# Patient Record
Sex: Male | Born: 1980 | Race: White | Hispanic: No | Marital: Married | Smoking: Current every day smoker
Health system: Southern US, Community
[De-identification: ages and names within clinical notes are randomized; demographics above are authoritative.]

---

## 2016-07-25 ENCOUNTER — Ambulatory Visit (INDEPENDENT_AMBULATORY_CARE_PROVIDER_SITE_OTHER): Payer: Self-pay | Admitting: Physician Assistant

## 2016-07-25 ENCOUNTER — Ambulatory Visit (HOSPITAL_COMMUNITY)
Admission: RE | Admit: 2016-07-25 | Discharge: 2016-07-25 | Disposition: A | Discharge status: Home / Self Care | Payer: Self-pay | Source: Ambulatory Visit | Attending: Physician Assistant | Admitting: Physician Assistant

## 2016-07-25 VITALS — BP 130/74 | HR 64 | Temp 98.4°F | Resp 18 | Wt 199.0 lb

## 2016-07-25 DIAGNOSIS — N5089 Other specified disorders of the male genital organs: Secondary | ICD-10-CM

## 2016-07-25 DIAGNOSIS — S3994XA Unspecified injury of external genitals, initial encounter: Secondary | ICD-10-CM

## 2016-07-25 DIAGNOSIS — S39848A Other specified injuries of external genitals, initial encounter: Secondary | ICD-10-CM

## 2016-07-25 DIAGNOSIS — N50812 Left testicular pain: Secondary | ICD-10-CM

## 2016-07-25 DIAGNOSIS — X58XXXA Exposure to other specified factors, initial encounter: Secondary | ICD-10-CM | POA: Insufficient documentation

## 2016-07-25 LAB — POCT URINALYSIS DIP (MANUAL ENTRY)
BILIRUBIN UA: NEGATIVE
BILIRUBIN UA: NEGATIVE
Glucose, UA: NEGATIVE
LEUKOCYTES UA: NEGATIVE
Nitrite, UA: NEGATIVE
Protein Ur, POC: NEGATIVE
Spec Grav, UA: 1.015
Urobilinogen, UA: 0.2
pH, UA: 7

## 2016-07-25 NOTE — Progress Notes (Signed)
Mitchell Ballard  MRN: [id number] DOB: [birth year]  Subjective:  Pt presents to clinic injury to L side of scrotum on Friday - his daughter juped on him and landed on his groin - Sat Mitchell Ballard started with pain which continued Sunday - yesterday the pain was the worse and Mitchell Ballard noticed swelling. (Mitchell Ballard did work a full shift)  Today the pain is better and the swelling is about the same.  No change in urine.  No change in sexual partner. No urinary symptoms.  Mitchell Ballard has used some ice which has helped a little -- Mitchell Ballard rested over the weekend.  Review of Systems  Constitutional: Negative for chills and fever.  Genitourinary: Positive for scrotal swelling and testicular pain. Negative for dysuria, penile pain, penile swelling and urgency.    There are no active problems to display for this patient.   No current outpatient prescriptions on file prior to visit.   No current facility-administered medications on file prior to visit.     No Known Allergies  Pt patients past, family and social history were reviewed and updated.  Objective:  BP 130/74 (BP Location: Right Arm, Patient Position: Sitting, Cuff Size: Small)   Pulse 64   Temp 98.4 F (36.9 C) (Oral)   Resp 18   Wt 199 lb (90.3 kg)   SpO2 100%   Physical Exam  Constitutional: Mitchell Ballard is oriented to person, place, and time and well-developed, well-nourished, and in no distress.  HENT:  Head: Normocephalic and atraumatic.  Right Ear: External ear normal.  Left Ear: External ear normal.  Eyes: Conjunctivae are normal.  Neck: Normal range of motion.  Cardiovascular: Normal rate, regular rhythm and normal heart sounds.   No murmur heard. Pulmonary/Chest: Effort normal and breath sounds normal. Mitchell Ballard has no wheezes.  Genitourinary: Penis normal. Mitchell Ballard exhibits testicular tenderness (left). Mitchell Ballard exhibits no abnormal testicular mass and no scrotal tenderness.  Genitourinary Comments: Left testicle is at a different angle that his right - scrotum has minimal  swelling and no ecchymosis  Neurological: Mitchell Ballard is alert and oriented to person, place, and time. Gait normal.  Skin: Skin is warm and dry.  Psychiatric: Mood, memory, affect and judgment normal.   Results for orders placed or performed in visit on 07/25/16  POCT urinalysis dipstick  Result Value Ref Range   Color, UA yellow yellow   Clarity, UA clear clear   Glucose, UA negative negative   Bilirubin, UA negative negative   Ketones, POC UA negative negative   Spec Grav, UA 1.015    Blood, UA trace-intact (A) negative   pH, UA 7.0    Protein Ur, POC negative negative   Urobilinogen, UA 0.2    Nitrite, UA Negative Negative   Leukocytes, UA Negative Negative   US Scrotum  Result Date: 07/25/2016 CLINICAL DATA:  Left testicular pain and swelling after injury common daughter jumped onto the patient's scrotum. EXAM: SCROTAL ULTRASOUND DOPPLER ULTRASOUND OF THE TESTICLES TECHNIQUE: Complete ultrasound examination of the testicles, epididymis, and other scrotal structures was performed. Color and spectral Doppler ultrasound were also utilized to evaluate blood flow to the testicles. COMPARISON:  None. FINDINGS: Right testicle Measurements: 4.5 x 2.3 x 3.0 cm. No mass or microlithiasis visualized. Left testicle Measurements: 4.0 x 3.1 x 3.5 cm. Multiple heterogeneous hypoechoic areas are seen scattered throughout the diffusely heterogeneous testicular parenchyma. On static 2D images, most of these areas have rounded configuration, but reviewing this in a loops obtained by the sonographer, many are actually  platelike and linear in configuration, consistent with fracture planes. Right epididymis:  Normal in size and appearance. Left epididymis:  Slightly prominent in echogenicity and size. Hydrocele: Trace amount of simple appearing free fluid is seen in the right hemiscrotum. There is a somewhat larger amount of fluid identified in the left hemiscrotum which is complex. 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 Pulsed Doppler interrogation of both testes demonstrates normal low resistance arterial and venous waveforms bilaterally. IMPRESSION: 1. Multi focal heterogeneous and hypoechoic lesions throughout the parenchyma of the left testicle. Although these appear fairly rounded in appearance on static 2D images, cine imaging shows many have a more platelike and linear appearance, suggesting fracture through the testicular parenchyma. Overall echogenicity of the left testicle is markedly heterogeneous. Given the provided history, imaging features today are felt to be most consistent with left testicular trauma with multi focal parenchymal laceration and intraparenchymal hematoma of the left testicle. Probable small associated left-sided intrascrotal hemorrhage. Although multi focal testicular tumor is considered much less likely, ultrasound follow-up is recommended as changes related to trauma should resolve over time. Electronically Signed   By: Kennith Center M.D.   On: 07/25/2016 15:11   [place] Art/ven Flow Abd Pelv Doppler  Result Date: 07/25/2016 CLINICAL DATA:  Left testicular pain and swelling after injury common daughter jumped onto the patient's scrotum. EXAM: SCROTAL ULTRASOUND DOPPLER ULTRASOUND OF THE TESTICLES TECHNIQUE: Complete ultrasound examination of the testicles, epididymis, and other scrotal structures was performed. Color and spectral Doppler ultrasound were also utilized to evaluate blood flow to the testicles. COMPARISON:  None. FINDINGS: Right testicle Measurements: 4.5 x 2.3 x 3.0 cm. No mass or microlithiasis visualized. Left testicle Measurements: 4.0 x 3.1 x 3.5 cm. Multiple heterogeneous hypoechoic areas are seen scattered throughout the diffusely heterogeneous testicular parenchyma. On static 2D images, most of these areas have rounded configuration, but reviewing this in a loops obtained by the sonographer, many are actually platelike and linear in  configuration, consistent with fracture planes. Right epididymis:  Normal in size and appearance. Left epididymis:  Slightly prominent in echogenicity and size. Hydrocele: Trace amount of simple appearing free fluid is seen in the right hemiscrotum. There is a somewhat larger amount of fluid identified in the left hemiscrotum which is complex. 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 Pulsed Doppler interrogation of both testes demonstrates normal low resistance arterial and venous waveforms bilaterally. IMPRESSION: 1. Multi focal heterogeneous and hypoechoic lesions throughout the parenchyma of the left testicle. Although these appear fairly rounded in appearance on static 2D images, cine imaging shows many have a more platelike and linear appearance, suggesting fracture through the testicular parenchyma. Overall echogenicity of the left testicle is markedly heterogeneous. Given the provided history, imaging features today are felt to be most consistent with left testicular trauma with multi focal parenchymal laceration and intraparenchymal hematoma of the left testicle. Probable small associated left-sided intrascrotal hemorrhage. Although multi focal testicular tumor is considered much less likely, ultrasound follow-up is recommended as changes related to trauma should resolve over time. Electronically Signed   By: Kennith Center M.D.   On: 07/25/2016 15:11     Assessment and Plan :  Testicular pain, left - Plan: [place] Art/Ven Flow Abd Pelv Doppler, US Scrotum  Swelling of left testicle - Plan: [place] Art/Ven Flow Abd Pelv Doppler, US Scrotum  Injury to scrotum, initial encounter - Plan: [place] Art/Ven Flow Abd Pelv Doppler, POCT urinalysis dipstick, US Scrotum   Pt to continue ice  and rest - Mitchell Ballard should get some tighter underwear for more support.  Send for US to determine blood flow status and this will help determine next step.  Selena BattenKim - [phone]- [phone] - Mitchell Ballard would like call report to  go to wife - spoke with wife they will decided the next step - Mitchell Ballard will add motrin for pain control and continue the ice and support - d/w pt's wife the possibility of sterility and if Mitchell Ballard develops fever and increased swelling or increase pain that infection is possible though not likely but Mitchell Ballard Mitchell Ballard develops any of these symptoms Mitchell Ballard will RTC for further US and evaluation.  Spoke with Dr Marlou PorchHerrick who reviewed the US.  Supportive care with ice and support and high dose motrin for the pain - likely his testicle will atrophy and Mitchell Ballard has the risk of sterility due to sperm antibodies developing.  No surgical options at this point to change the outcome of possible sterility.  Benny LennertSarah Samiah Ricklefs PA-C  [facility] 07/25/2016 9:04 AM

## 2016-07-25 NOTE — Patient Instructions (Addendum)
  You have an appointment to day at 1pm at [facility] for your ultrasound.  You are to arrive at 1245pm.   Address: [address], [place], [place] [address].  Phone: [phone]     IF you received an x-ray today, you will receive an invoice from [facility]. Please contact [facility] at [phone] with questions or concerns regarding your invoice.   IF you received labwork today, you will receive an invoice from [facility]. Please contact [facility] at [phone] with questions or concerns regarding your invoice.   Our billing staff will not be able to assist you with questions regarding bills from these companies.  You will be contacted with the lab results as soon as they are available. The fastest way to get your results is to activate your My Chart account. Instructions are located on the last page of this paperwork. If you have not heard from us regarding the results in 2 weeks, please contact this office.

## 2016-07-26 ENCOUNTER — Telehealth: Payer: Self-pay

## 2016-07-26 NOTE — Telephone Encounter (Signed)
Selena BattenKim would like to ask Maralyn SagoSarah some questions about the patient's conditions. Please call! [phone]

## 2016-07-27 NOTE — Telephone Encounter (Signed)
Called and spoke with patient's wife - answered questions.

## 2018-01-04 ENCOUNTER — Encounter: Payer: Self-pay | Admitting: Physician Assistant

## 2018-07-05 IMAGING — US US SCROTUM
1 series · 13 of 25 positions shown · non-contrast
Comparison: None.

CLINICAL DATA: Left testicular pain and swelling after injury
common daughter jumped onto the patient's scrotum.

EXAM:
SCROTAL ULTRASOUND
DOPPLER ULTRASOUND OF THE TESTICLES
TECHNIQUE: Complete ultrasound examination of the testicles, epididymis, and
other scrotal structures was performed. Color and spectral Doppler
ultrasound were also utilized to evaluate blood flow to the
testicles.

[Series 1: us scrotum · 0.09mm/px · 130 acquisitions, 13 frames shown]
[im 1/130]
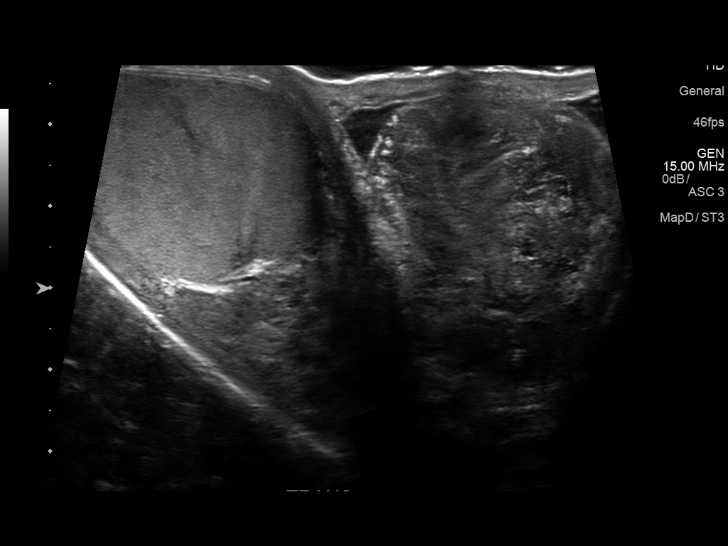
[im 11/130]
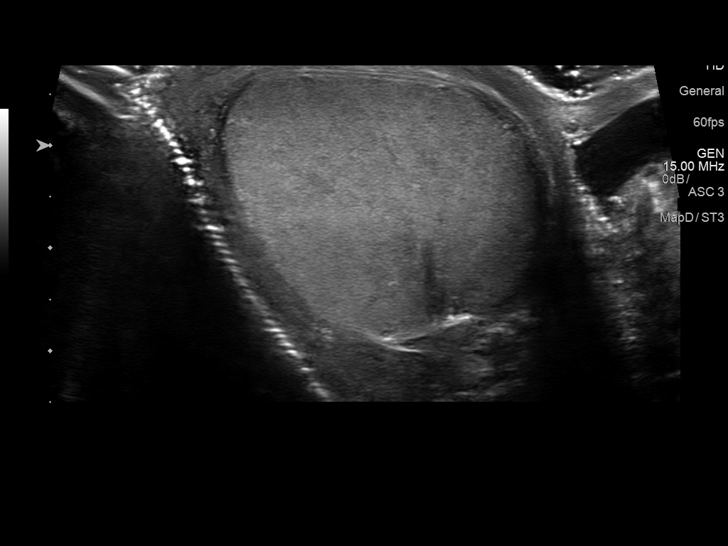
[im 22/130]
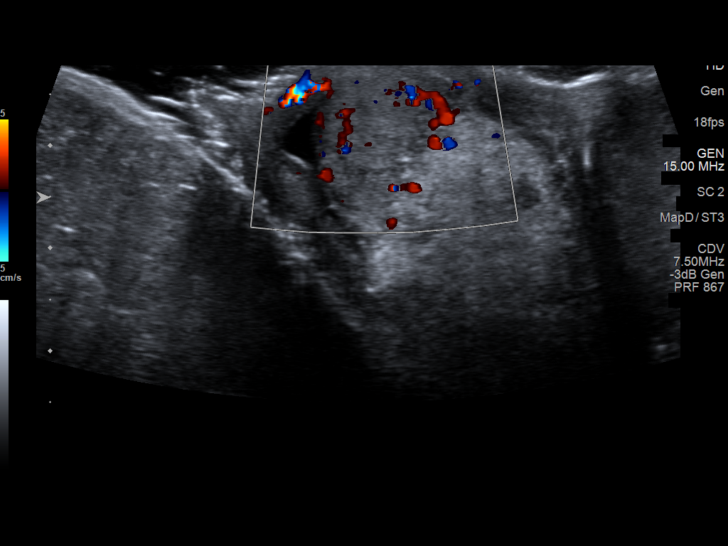
[im 33/130]
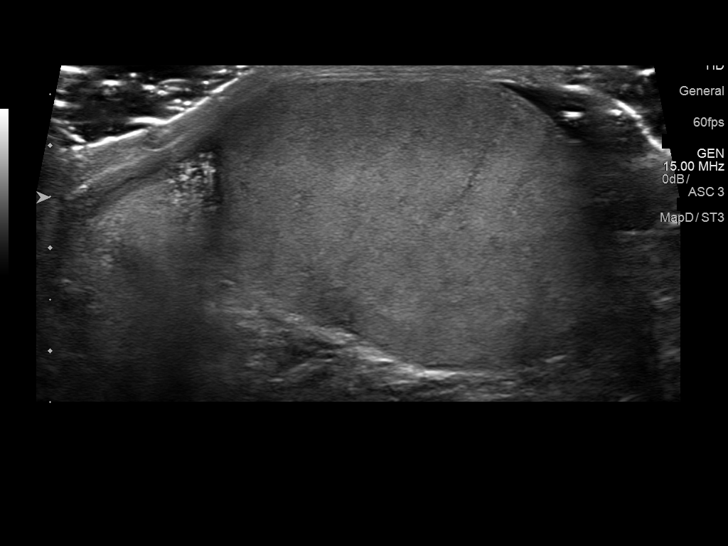
[im 44/130]
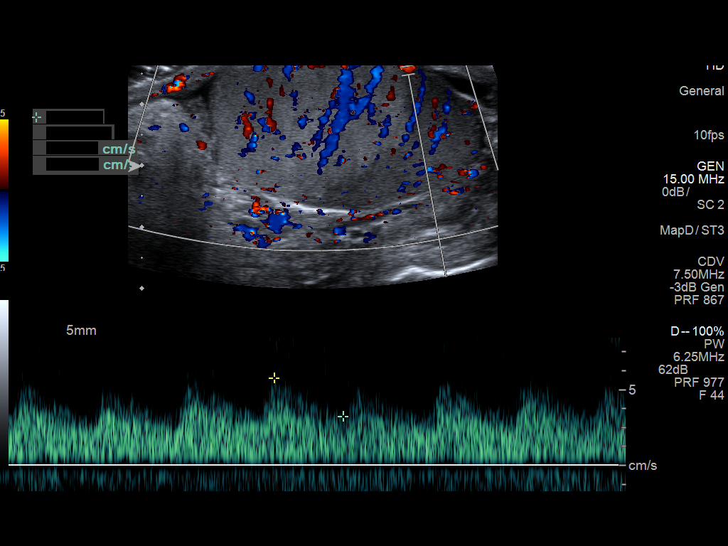
[im 54/130]
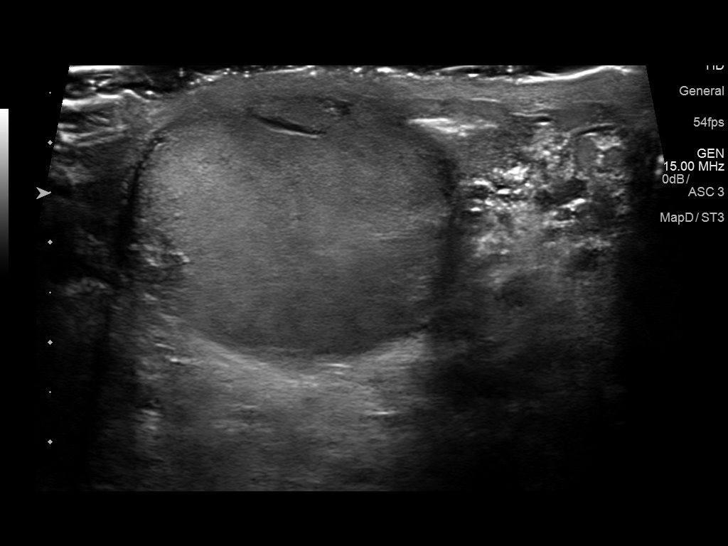
[im 65/130]
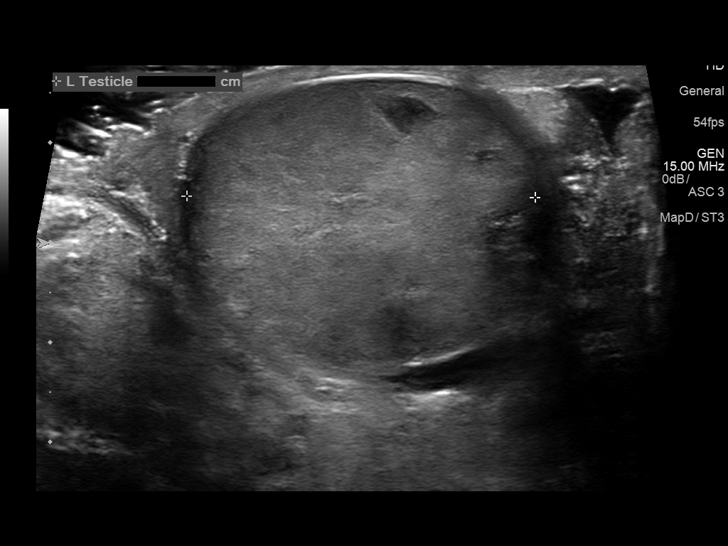
[im 76/130]
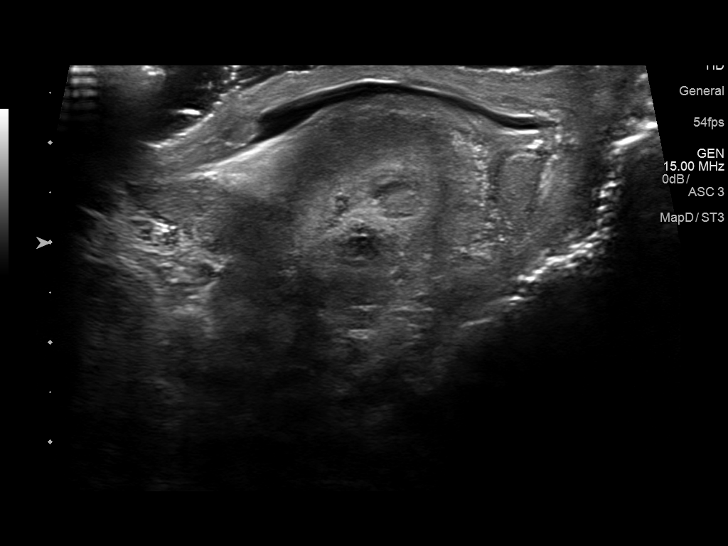
[im 87/130]
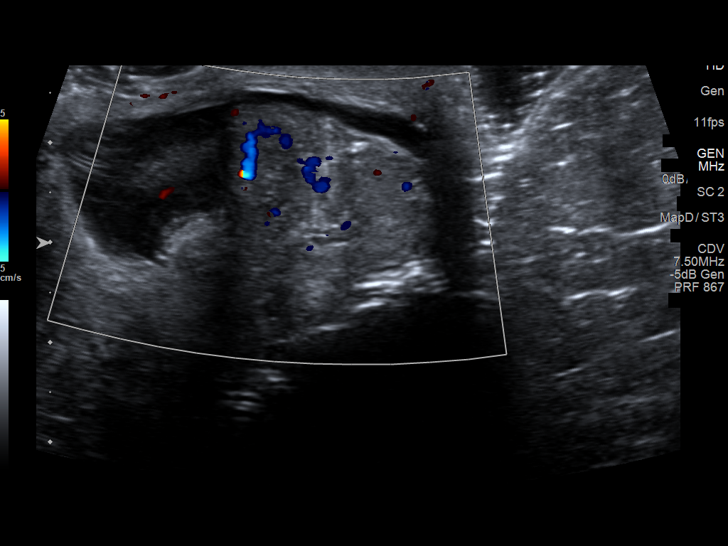
[im 97/130]
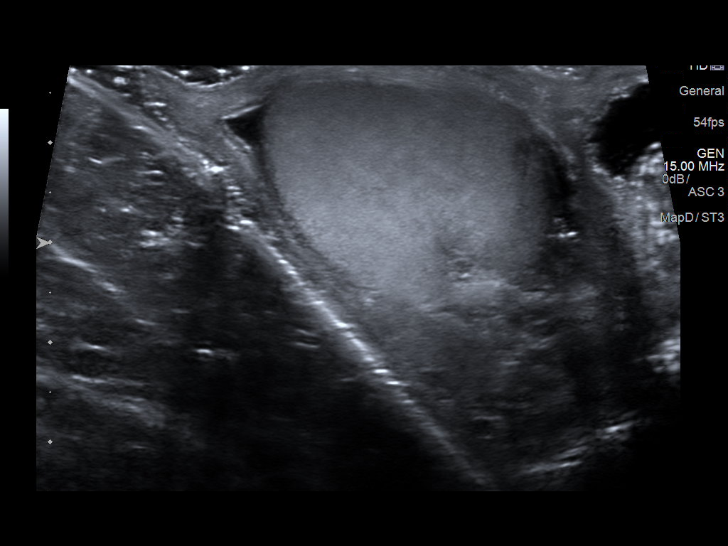
[im 108/130]
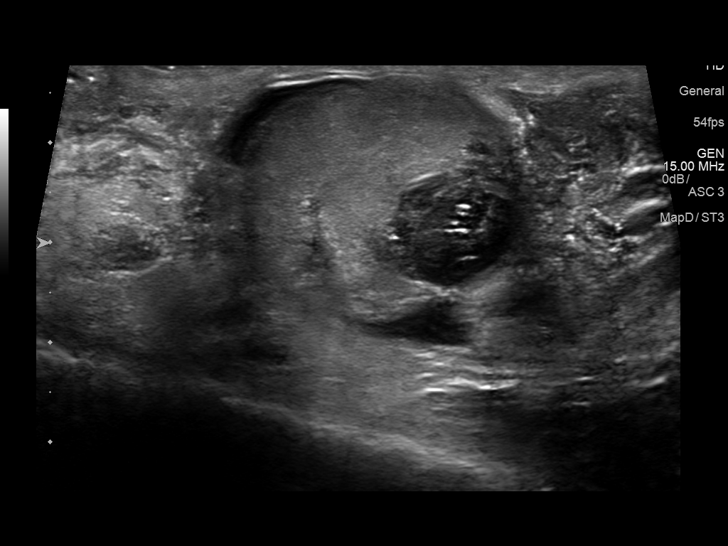
[im 119/130]
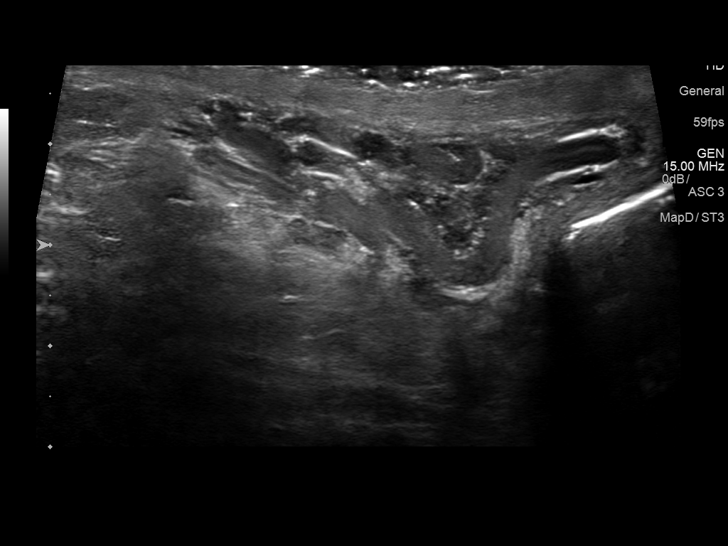
[im 130/130]
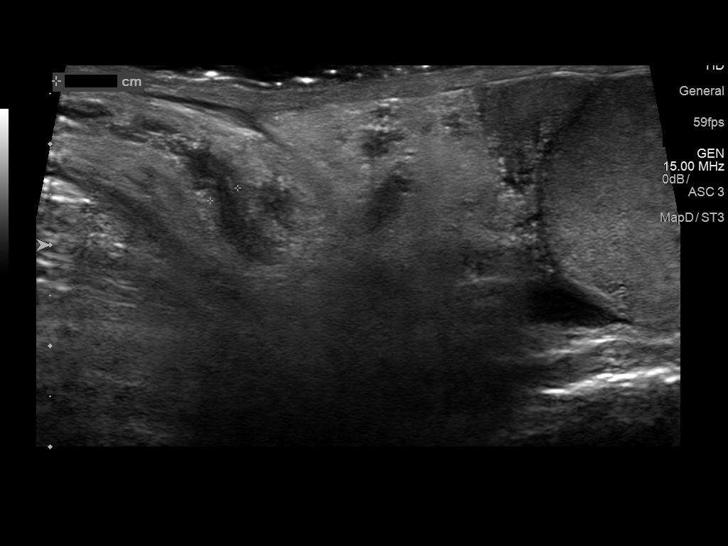

[13 of 25 positions shown; findings below may reference images not displayed]

FINDINGS: Right testicle

Measurements: 4.5 x 2.3 x 3.0 cm. No mass or microlithiasis
visualized.

Left testicle

Measurements: 4.0 x 3.1 x 3.5 cm. Multiple heterogeneous hypoechoic
areas are seen scattered throughout the diffusely heterogeneous
testicular parenchyma. On static 2D images, most of these areas have
rounded configuration, but reviewing this in a loops obtained by the
sonographer, many are actually platelike and linear in
configuration, consistent with fracture planes.

Right epididymis:  Normal in size and appearance.

Left epididymis:  Slightly prominent in echogenicity and size.

Hydrocele: Trace amount of simple appearing free fluid is seen in
the right hemiscrotum. There is a somewhat larger amount of fluid
identified in the left hemiscrotum which is complex.

Varicocele: With Valsalva maneuver, bilateral varicoceles are
demonstrated.

Pulsed Doppler interrogation of both testes demonstrates normal low
resistance arterial and venous waveforms bilaterally.
IMPRESSION: 1. Multi focal heterogeneous and hypoechoic lesions throughout the
parenchyma of the left testicle. Although these appear fairly
rounded in appearance on static 2D images, cine imaging shows many
have a more platelike and linear appearance, suggesting fracture
through the testicular parenchyma. Overall echogenicity of the left
testicle is markedly heterogeneous. Given the provided history,
imaging features today are felt to be most consistent with left
testicular trauma with multi focal parenchymal laceration and
intraparenchymal hematoma of the left testicle. Probable small
associated left-sided intrascrotal hemorrhage. Although multi focal
testicular tumor is considered much less likely, ultrasound
follow-up is recommended as changes related to trauma should resolve
over time.
